# Patient Record
Sex: Female | Born: 1965 | Race: White | Hispanic: No | Marital: Married | Smoking: Former smoker
Health system: Southern US, Community
[De-identification: ages and names within clinical notes are randomized; demographics above are authoritative.]

## PROBLEM LIST (undated history)

## (undated) DIAGNOSIS — K219 Gastro-esophageal reflux disease without esophagitis: Secondary | ICD-10-CM

## (undated) DIAGNOSIS — E7800 Pure hypercholesterolemia, unspecified: Secondary | ICD-10-CM

## (undated) HISTORY — PX: SKIN CANCER EXCISION: SHX779

---

## 2018-01-11 ENCOUNTER — Emergency Department (HOSPITAL_BASED_OUTPATIENT_CLINIC_OR_DEPARTMENT_OTHER): Payer: Worker's Compensation

## 2018-01-11 ENCOUNTER — Encounter (HOSPITAL_BASED_OUTPATIENT_CLINIC_OR_DEPARTMENT_OTHER): Payer: Self-pay

## 2018-01-11 ENCOUNTER — Emergency Department (HOSPITAL_BASED_OUTPATIENT_CLINIC_OR_DEPARTMENT_OTHER)
Admission: EM | Admit: 2018-01-11 | Discharge: 2018-01-11 | Disposition: A | Discharge status: Home / Self Care | Payer: Worker's Compensation | Attending: Physician Assistant | Admitting: Physician Assistant

## 2018-01-11 ENCOUNTER — Other Ambulatory Visit: Payer: Self-pay

## 2018-01-11 DIAGNOSIS — S8012XA Contusion of left lower leg, initial encounter: Secondary | ICD-10-CM | POA: Diagnosis not present

## 2018-01-11 DIAGNOSIS — R2242 Localized swelling, mass and lump, left lower limb: Secondary | ICD-10-CM | POA: Diagnosis not present

## 2018-01-11 DIAGNOSIS — Y990 Civilian activity done for income or pay: Secondary | ICD-10-CM | POA: Diagnosis not present

## 2018-01-11 DIAGNOSIS — Y929 Unspecified place or not applicable: Secondary | ICD-10-CM | POA: Diagnosis not present

## 2018-01-11 DIAGNOSIS — Z87891 Personal history of nicotine dependence: Secondary | ICD-10-CM | POA: Diagnosis not present

## 2018-01-11 DIAGNOSIS — M25562 Pain in left knee: Secondary | ICD-10-CM | POA: Insufficient documentation

## 2018-01-11 DIAGNOSIS — S8992XA Unspecified injury of left lower leg, initial encounter: Secondary | ICD-10-CM | POA: Diagnosis present

## 2018-01-11 DIAGNOSIS — W19XXXA Unspecified fall, initial encounter: Secondary | ICD-10-CM | POA: Diagnosis not present

## 2018-01-11 DIAGNOSIS — G8911 Acute pain due to trauma: Secondary | ICD-10-CM | POA: Diagnosis not present

## 2018-01-11 DIAGNOSIS — Y939 Activity, unspecified: Secondary | ICD-10-CM | POA: Insufficient documentation

## 2018-01-11 HISTORY — DX: Pure hypercholesterolemia, unspecified: E78.00

## 2018-01-11 HISTORY — DX: Gastro-esophageal reflux disease without esophagitis: K21.9

## 2018-01-11 NOTE — ED Notes (Signed)
Patient transported to Ultrasound 

## 2018-01-11 NOTE — ED Triage Notes (Signed)
Pt c/o pain to left lE from knee to foot-initial 12/18/17-has been seen at [facility] x 5-pt is wearing knee brace-NAD-steady gait

## 2018-01-11 NOTE — Discharge Instructions (Signed)
Use ice 3-4 times daily alternating 20 minutes on, 20 minutes off.  Keep your leg elevated whenever you are not walking on it.  Take ibuprofen every 4-6 hours as needed for your pain.  Do not combine this with tramadol.  Please follow-up with orthopedic doctor for further evaluation and treatment of your symptoms.  Please return to the emergency department if you develop any new or worsening symptoms.

## 2018-01-11 NOTE — ED Provider Notes (Signed)
[facility] EMERGENCY DEPARTMENT Provider Note   CSN: [id number] Arrival date & time: 01/11/18  1524     History   Chief Complaint Chief Complaint  Patient presents with  . Leg Pain    HPI Misty Prince is a 52 y.o. female with history of GERD, hypercholesterolemia who presents with a 3-week history of left leg pain.  Patient reports she slipped and fell at work on 12/18/2017.  She continues to have left knee and leg pain with bruising.  She has been seen in urgent care 4-5 times.  She had negative x-rays there.  She continues to have pain and swelling to her knee.  She is able to walk with a brace.  She denies any numbness or tingling.  She has no history of blood clots.  She is not on any anticoagulation.  She denies any chest pain or shortness of breath or other complaints.  She has not seen orthopedist.  HPI  Past Medical History:  Diagnosis Date  . GERD (gastroesophageal reflux disease)   . High cholesterol     There are no active problems to display for this patient.   Past Surgical History:  Procedure Laterality Date  . SKIN CANCER EXCISION      OB History    No data available       Home Medications    Prior to Admission medications   Not on File    Family History No family history on file.  Social History Social History   Tobacco Use  . Smoking status: Former Games developermoker  . Smokeless tobacco: Never Used  Substance Use Topics  . Alcohol use: No    Frequency: Never  . Drug use: No     Allergies   Patient has no known allergies.   Review of Systems Review of Systems  Constitutional: Negative for fever.  Respiratory: Negative for shortness of breath.   Cardiovascular: Negative for chest pain.  Musculoskeletal: Positive for arthralgias (L knee) and joint swelling.  Skin: Positive for color change.  Neurological: Negative for numbness.     Physical Exam Updated Vital Signs BP (!) 144/84 (BP Location: Left Arm)   Pulse 94   Temp  98.5 F (36.9 C) (Oral)   Resp 18   Ht 5\' 7"  (1.702 m)   Wt 105 kg (231 lb 7.7 oz)   SpO2 98%   BMI 36.26 kg/m   Physical Exam  Constitutional: She appears well-developed and well-nourished. No distress.  HENT:  Head: Normocephalic and atraumatic.  Eyes: Conjunctivae are normal. Right eye exhibits no discharge. Left eye exhibits no discharge. No scleral icterus.  Neck: Normal range of motion.  Cardiovascular: Normal rate, regular rhythm, normal heart sounds and intact distal pulses. Exam reveals no gallop and no friction rub.  No murmur heard. Pulmonary/Chest: Effort normal and breath sounds normal. No stridor. No respiratory distress. She has no wheezes. She has no rales.  Musculoskeletal: She exhibits no edema.  L knee: Edema, mild warmth, positive anterior drawer test; ecchymosis to medial calf with overlying tenderness; some lateral joint line tenderness to the knee; no malleoli tenderness; full range of motion intact of the knee, however no pain with full flexion; no pain or laxity with varus and valgus stress, negative McMurray's and posterior drawer test  Neurological: She is alert. Coordination normal.  Skin: Skin is warm and dry. No rash noted. She is not diaphoretic. No pallor.  Psychiatric: She has a normal mood and affect.  Nursing note and  vitals reviewed.      ED Treatments / Results  Labs (all labs ordered are listed, but only abnormal results are displayed) Labs Reviewed - No data to display  EKG  EKG Interpretation None       Radiology Ct Knee Left Wo Contrast  Result Date: 01/11/2018 CLINICAL DATA:  Fall on 12/18/2017.  Knee pain.  Patient ambulatory. EXAM: CT OF THE  KNEE WITHOUT CONTRAST TECHNIQUE: Multidetector CT imaging of the knee was performed according to the standard protocol. Multiplanar CT image reconstructions were also generated. COMPARISON:  None. FINDINGS: Sclerotic area within the distal femoral shaft likely reflects old infarct or  enchondroma. This has a benign appearance and is not clinically relevant. No fracture. No joint effusion. Joint spaces are maintained. IMPRESSION: No acute bony abnormality.  No fracture.  No joint effusion. Sclerotic area within the distal femoral shaft likely reflects benign bone infarct or enchondroma. Electronically Signed   By: Charlett Nose M.D.   On: 01/11/2018 18:32   US Venous Img Lower  Left (dvt Study)  Result Date: 01/11/2018 CLINICAL DATA:  Left lower extremity pain, swelling. Recent left knee injury. EXAM: LEFT LOWER EXTREMITY VENOUS DOPPLER ULTRASOUND TECHNIQUE: Gray-scale sonography with graded compression, as well as color Doppler and duplex ultrasound were performed to evaluate the lower extremity deep venous systems from the level of the common femoral vein and including the common femoral, femoral, profunda femoral, popliteal and calf veins including the posterior tibial, peroneal and gastrocnemius veins when visible. The superficial great saphenous vein was also interrogated. Spectral Doppler was utilized to evaluate flow at rest and with distal augmentation maneuvers in the common femoral, femoral and popliteal veins. COMPARISON:  None. FINDINGS: Contralateral Common Femoral Vein: Respiratory phasicity is normal and symmetric with the symptomatic side. No evidence of thrombus. Normal compressibility. Common Femoral Vein: No evidence of thrombus. Normal compressibility, respiratory phasicity and response to augmentation. Saphenofemoral Junction: No evidence of thrombus. Normal compressibility and flow on color Doppler imaging. Profunda Femoral Vein: No evidence of thrombus. Normal compressibility and flow on color Doppler imaging. Femoral Vein: No evidence of thrombus. Normal compressibility, respiratory phasicity and response to augmentation. Popliteal Vein: No evidence of thrombus. Normal compressibility, respiratory phasicity and response to augmentation. Calf Veins: No evidence of  thrombus. Normal compressibility and flow on color Doppler imaging. Superficial Great Saphenous Vein: No evidence of thrombus. Normal compressibility. Venous Reflux:  None. Other Findings:  None. IMPRESSION: No evidence of deep venous thrombosis. Electronically Signed   By: Charlett Nose M.D.   On: 01/11/2018 21:02    Procedures Procedures (including critical care time)  Medications Ordered in ED Medications - No data to display   Initial Impression / Assessment and Plan / ED Course  I have reviewed the triage vital signs and the nursing notes.  Pertinent labs & imaging results that were available during my care of the patient were reviewed by me and considered in my medical decision making (see chart for details).     Patient continued knee pain and swelling and ecchymosis to lower leg since fall on 12/18/2017.  DVT study is negative today.  CT of the knee shows no acute bony abnormality, fracture, or joint effusion; sclerotic area within the distal femoral shaft likely reflects benign bone infarct or enchondroma.  I made patient aware of this.  Will have patient follow-up with orthopedics for further evaluation and treatment, as I feel there may be a soft tissue injury.  Patient has a brace that has  been helping her.  Supportive treatment discussed including ice, elevation, NSAIDs.  Return precautions discussed.  Patient understands and agrees with plan.  Patient vitals stable throughout ED course and discharged in satisfactory condition. I discussed patient case with Dr. Corlis Leak who guided the patient's management and agrees with plan.   Final Clinical Impressions(s) / ED Diagnoses   Final diagnoses:  Acute pain of left knee  Traumatic ecchymosis of left lower leg, initial encounter    ED Discharge Orders    None       Emi Holes, PA-C 01/12/18 0037    Abelino Derrick, MD 01/18/18 1435

## 2019-01-08 IMAGING — CT CT KNEE*L* W/O CM
3 of 4 series · 12 of 33 positions shown, 13 images · non-contrast
Comparison: None.

CLINICAL DATA: Fall on 12/18/2017.  Knee pain.  Patient ambulatory.

EXAM:
CT OF THE  KNEE WITHOUT CONTRAST
TECHNIQUE: Multidetector CT imaging of the knee was performed according to the
standard protocol. Multiplanar CT image reconstructions were also
generated.

[Series 5: cor bone · coronal · 0.40mm/px · 3 of 172 slices shown]
[im 35/172  bone]
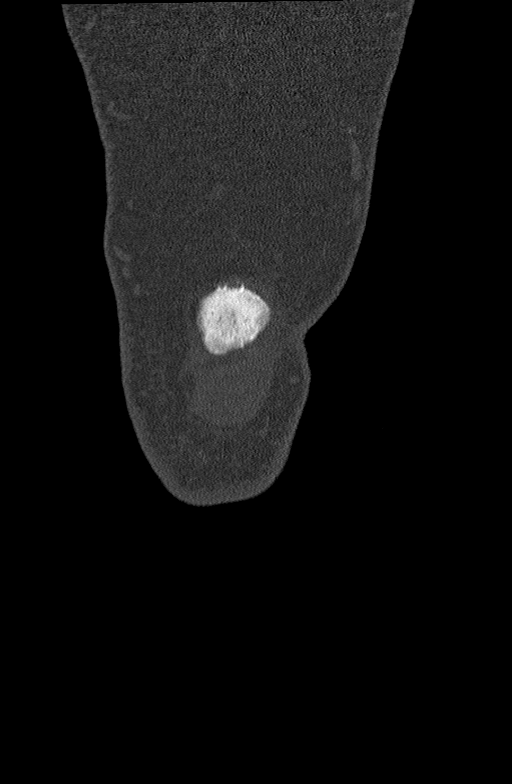
[im 69/172  bone]
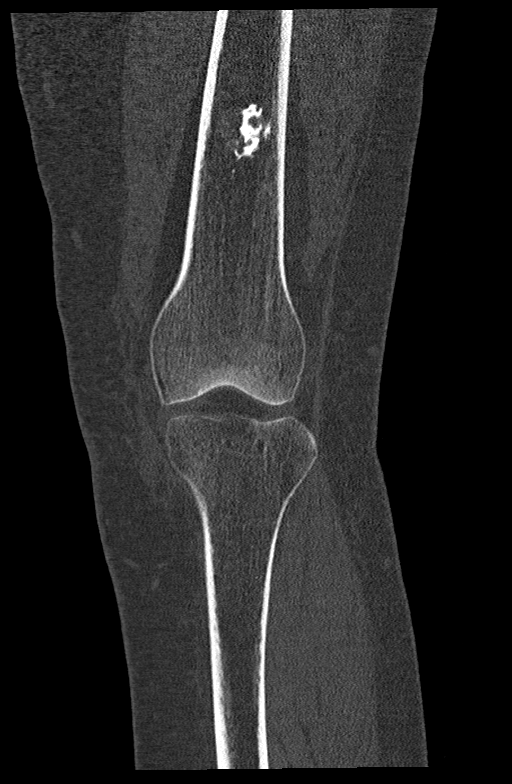
[im 103/172  bone]
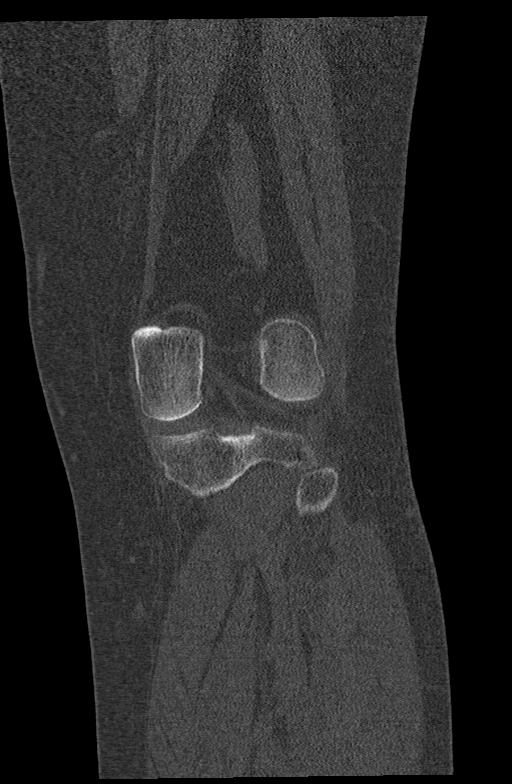

[Series 7: axial st · axial · 0.32mm/px · z∈[-901,-696]mm · 4 of 298 slices shown, 5 images]
[im 46/298  soft-tissue]
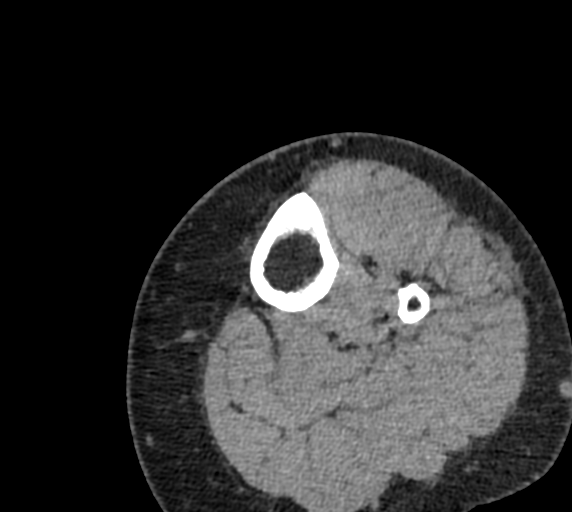
[im 46/298  bone]
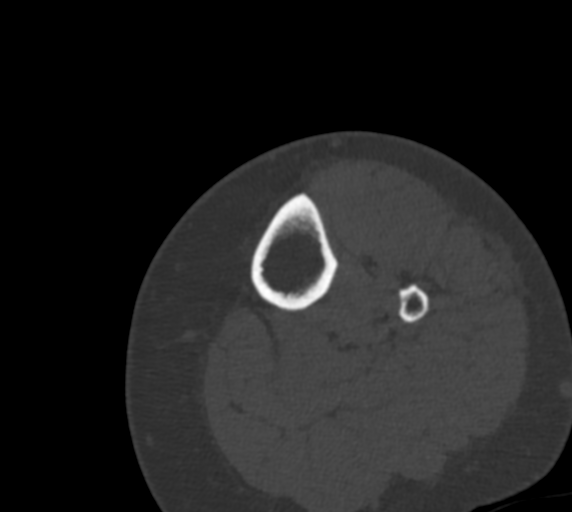
[im 115/298  bone]
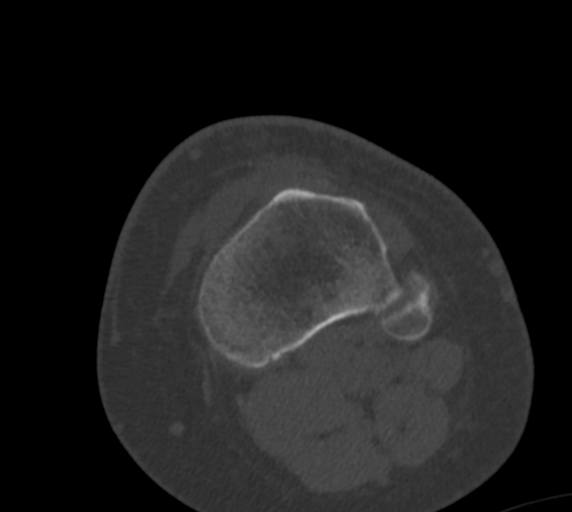
[im 183/298  bone]
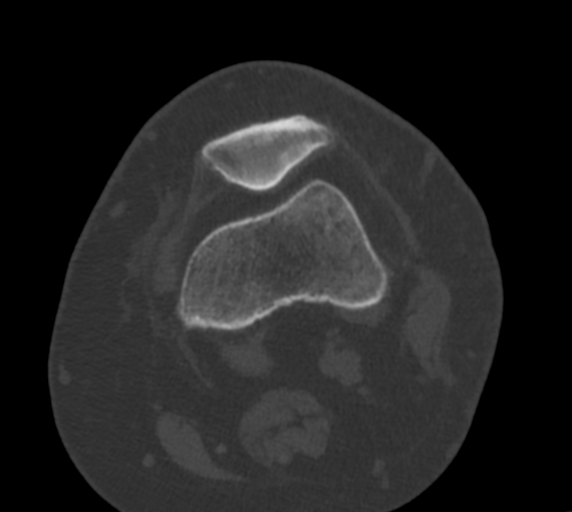
[im 252/298  bone]
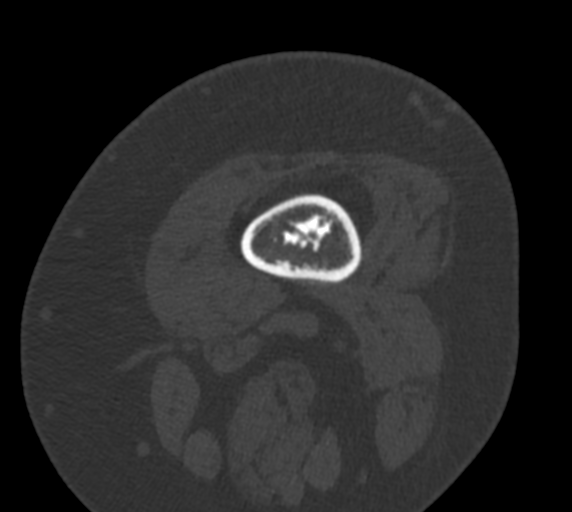

[Series 8: cor st · sagittal · 0.34mm/px · 5 of 204 slices shown]
[im 34/204  bone]
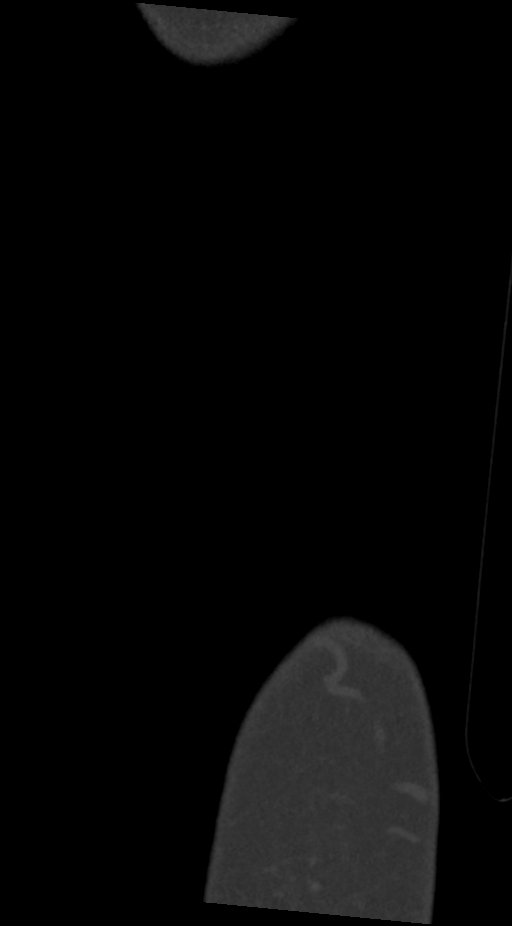
[im 68/204  bone]
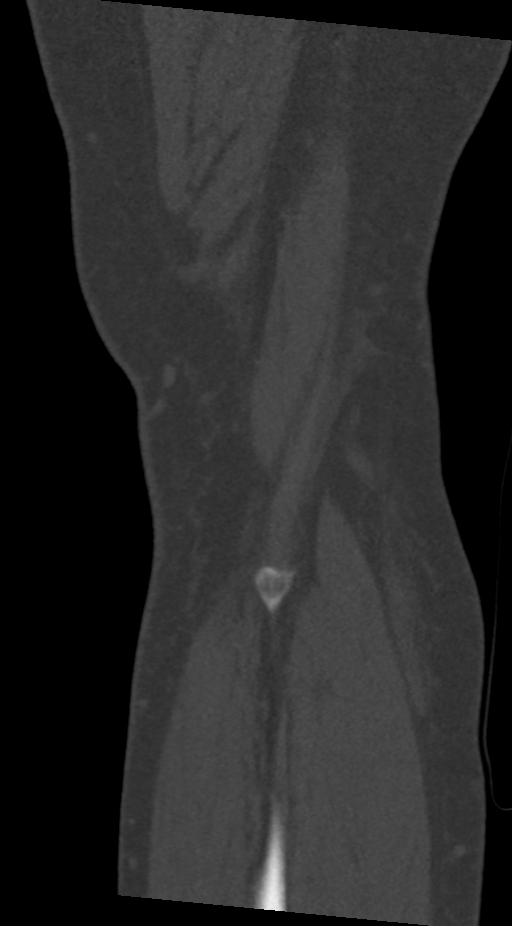
[im 102/204  bone]
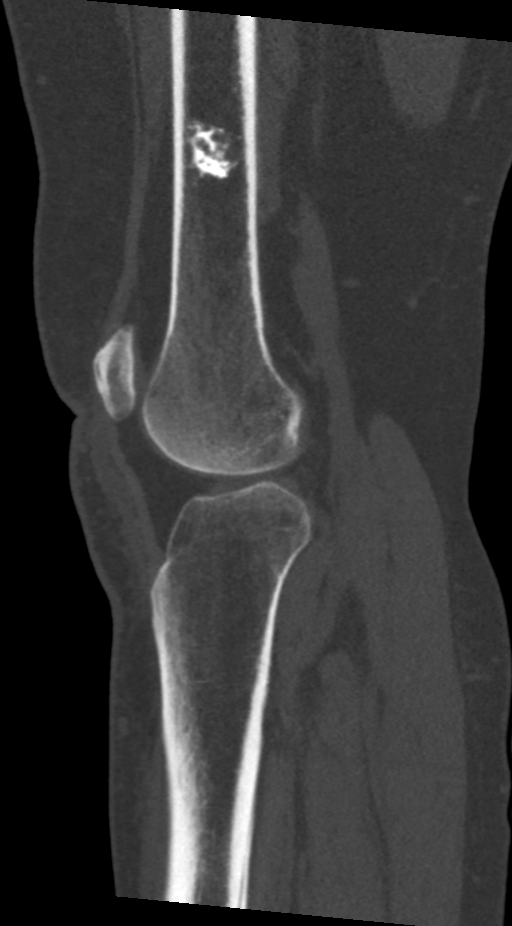
[im 136/204  bone]
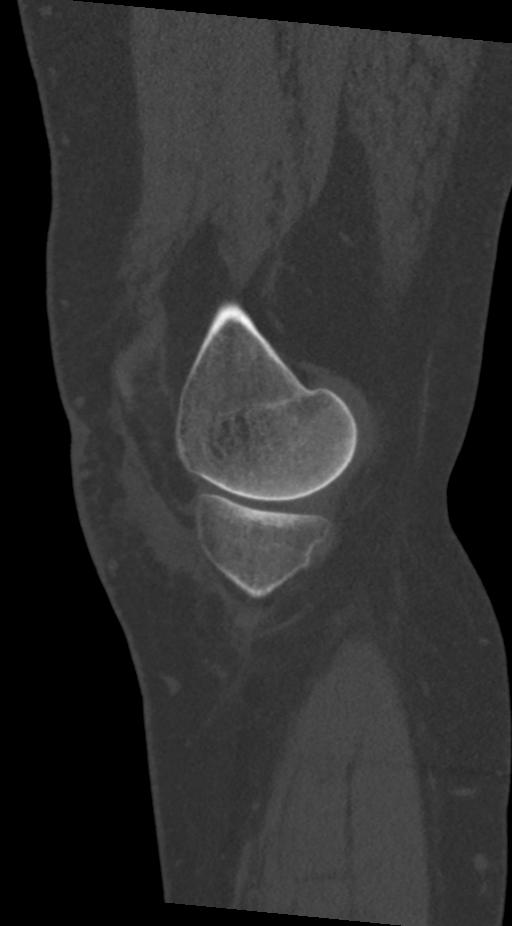
[im 170/204  bone]
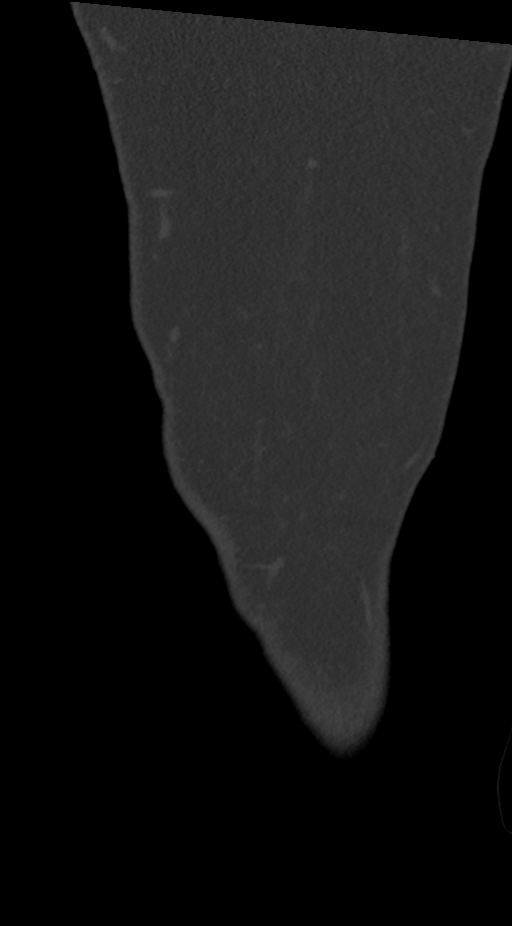

[12 of 33 positions shown; findings below may reference images not displayed]

FINDINGS: Sclerotic area within the distal femoral shaft likely reflects old
infarct or enchondroma. This has a benign appearance and is not
clinically relevant. No fracture. No joint effusion. Joint spaces
are maintained.
IMPRESSION: No acute bony abnormality.  No fracture.  No joint effusion.

Sclerotic area within the distal femoral shaft likely reflects
benign bone infarct or enchondroma.

## 2019-07-20 IMAGING — US US EXTREM LOW VENOUS*L*
1 series · 13 of 24 positions shown · non-contrast
Comparison: None.

CLINICAL DATA: Left lower extremity pain, swelling. Recent left
knee injury.



[Series 1: us extrem low venous*left* · 0.09mm/px · 13 of 26 slices shown]
[im 1/26]
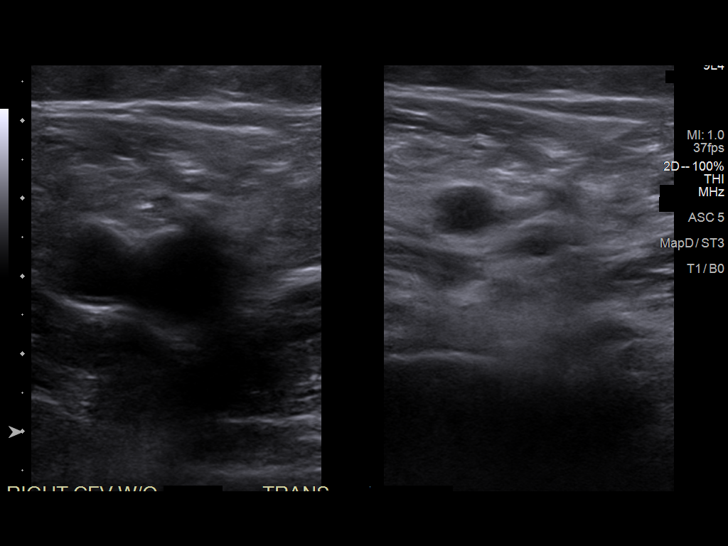
[im 3/26]
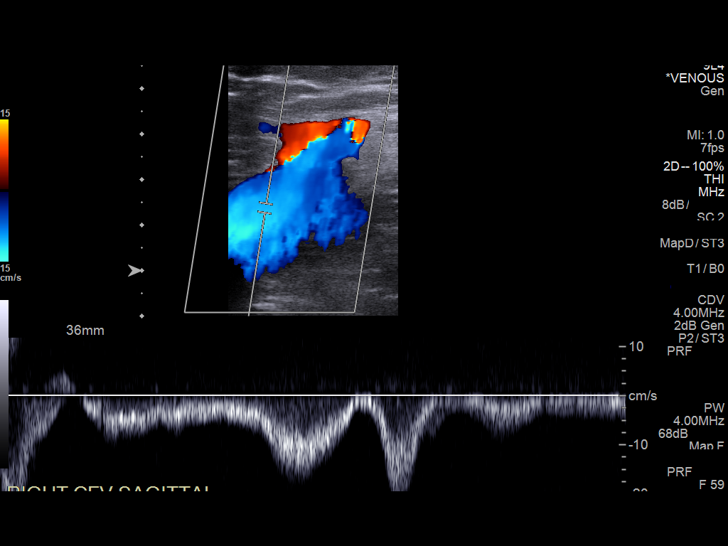
[im 5/26]
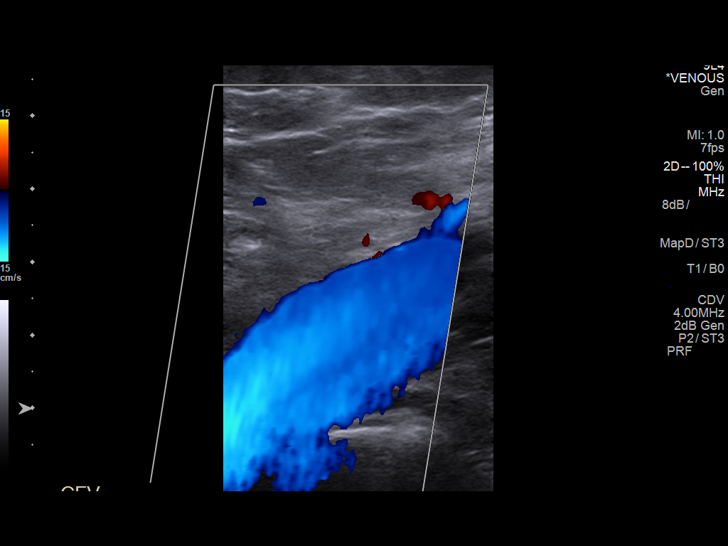
[im 7/26]
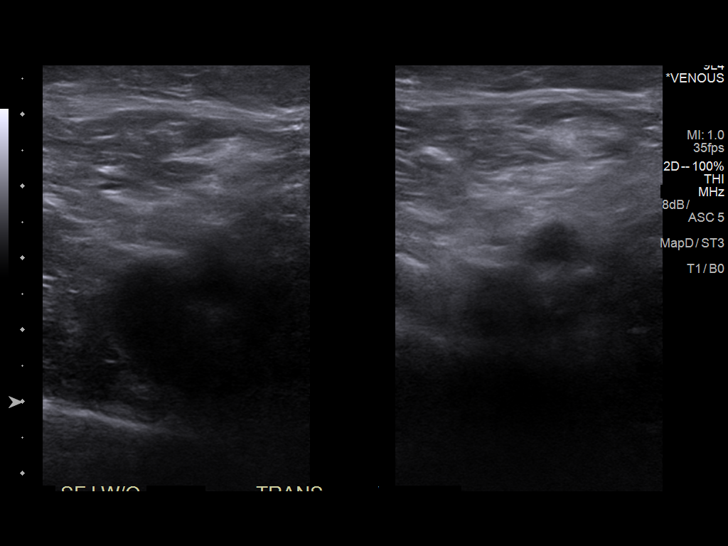
[im 9/26]
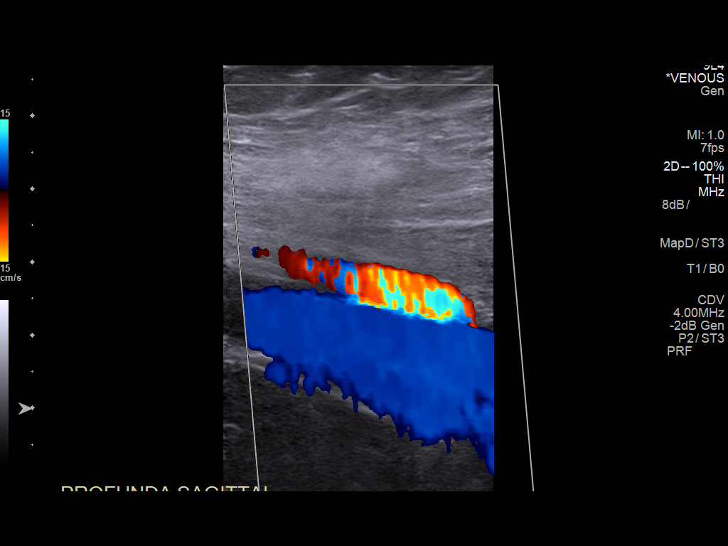
[im 11/26]
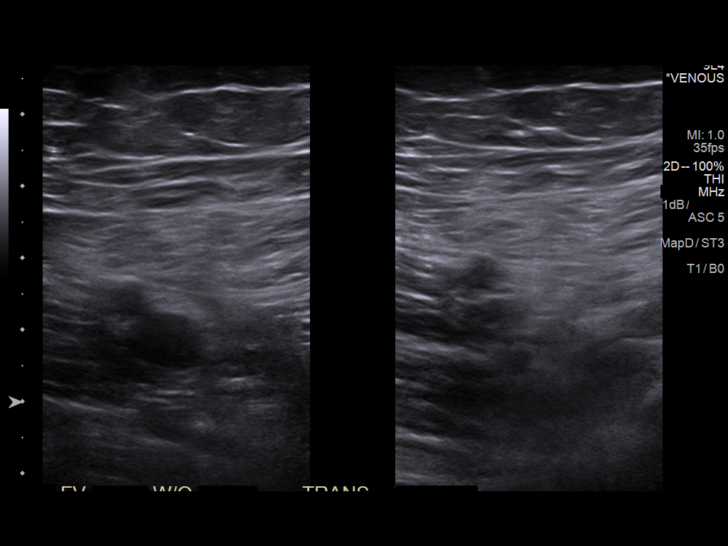
[im 14/26]
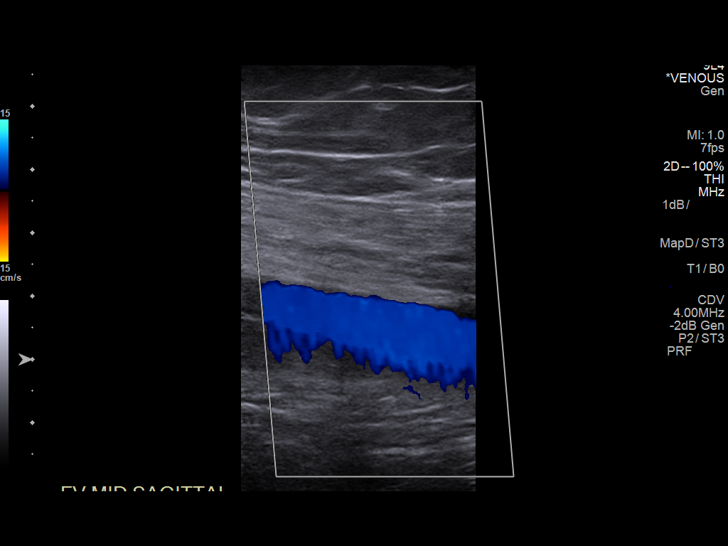
[im 15/26]
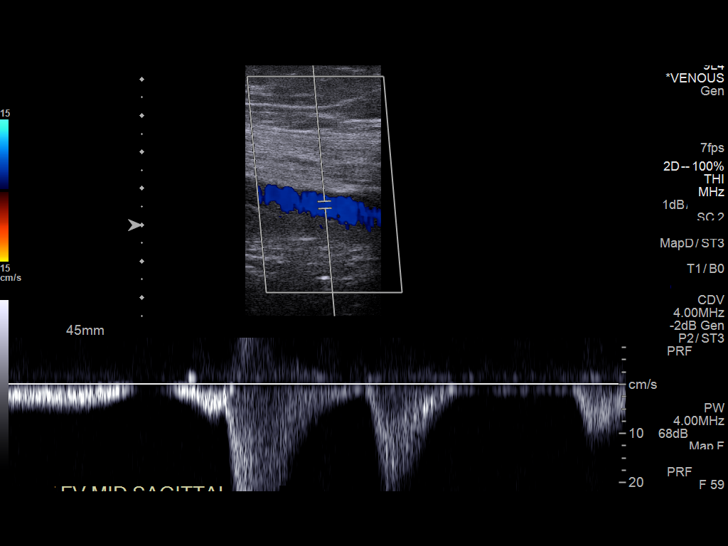
[im 17/26]
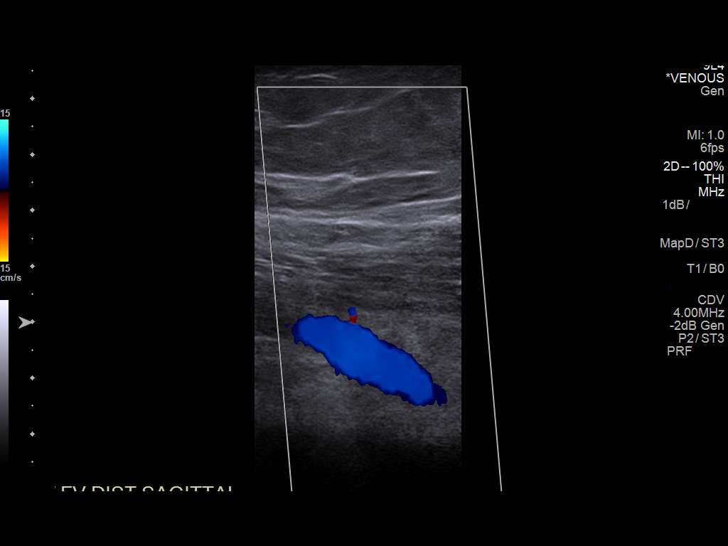
[im 19/26]
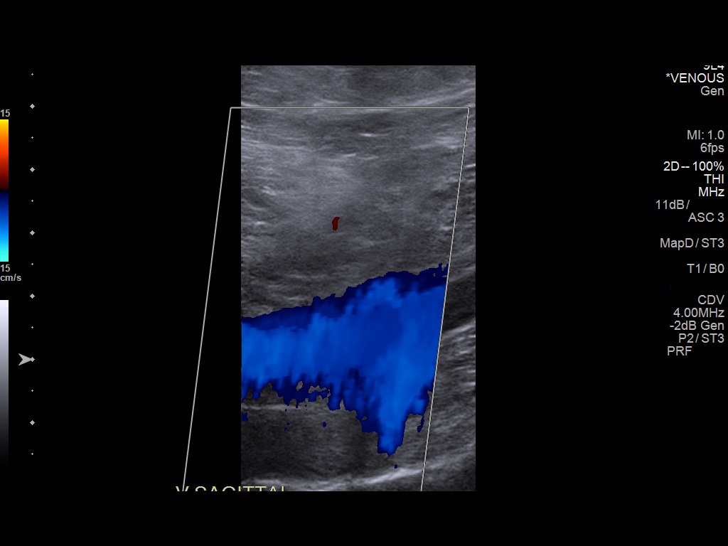
[im 21/26]
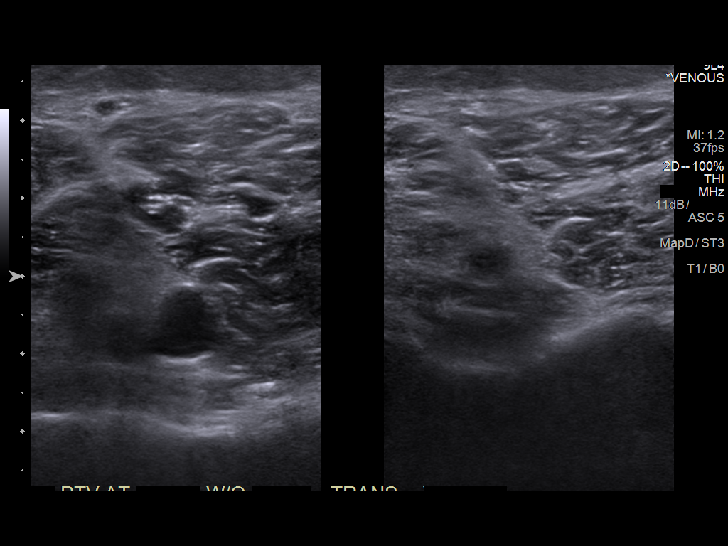
[im 23/26]
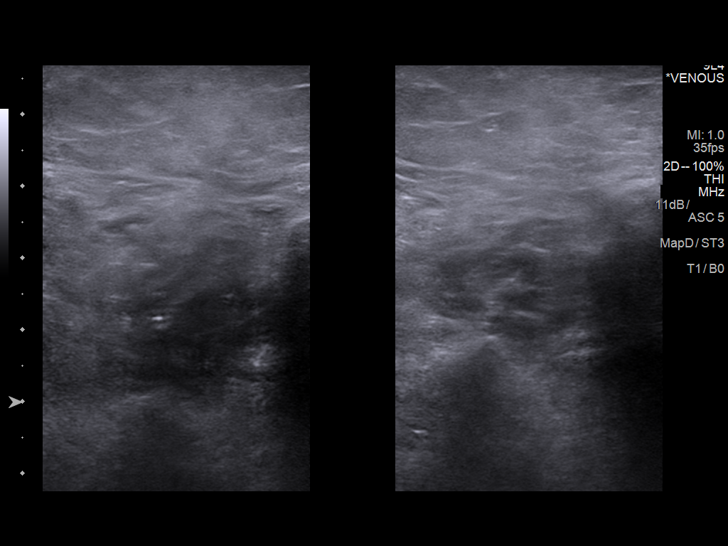
[im 26/26]
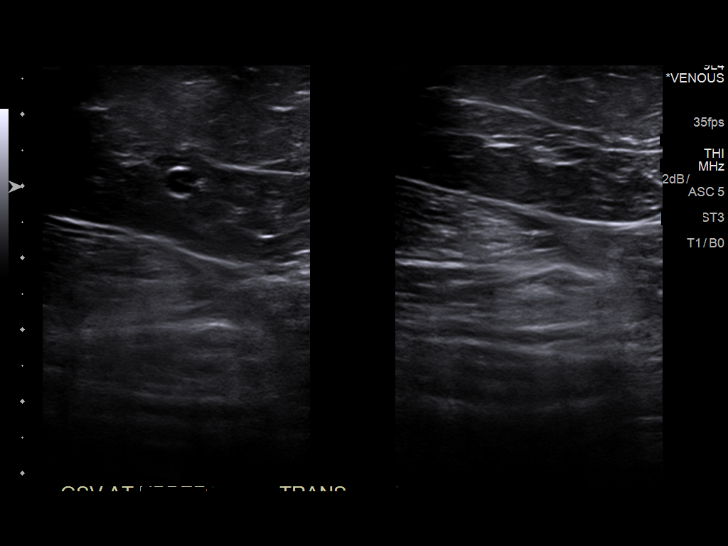

[13 of 24 positions shown; findings below may reference images not displayed]

FINDINGS: Contralateral Common Femoral Vein: Respiratory phasicity is normal
and symmetric with the symptomatic side. No evidence of thrombus.
Normal compressibility.

Common Femoral Vein: No evidence of thrombus. Normal
compressibility, respiratory phasicity and response to augmentation.

Saphenofemoral Junction: No evidence of thrombus. Normal
compressibility and flow on color Doppler imaging.

Profunda Femoral Vein: No evidence of thrombus. Normal
compressibility and flow on color Doppler imaging.

Femoral Vein: No evidence of thrombus. Normal compressibility,
respiratory phasicity and response to augmentation.

Popliteal Vein: No evidence of thrombus. Normal compressibility,
respiratory phasicity and response to augmentation.

Calf Veins: No evidence of thrombus. Normal compressibility and flow
on color Doppler imaging.

Superficial Great Saphenous Vein: No evidence of thrombus. Normal
compressibility.

Venous Reflux:  None.

Other Findings:  None.
IMPRESSION: No evidence of deep venous thrombosis.
